# Patient Record
Sex: Female | Born: 1940 | Race: White | Hispanic: No | Marital: Married | Smoking: Former smoker
Health system: Southern US, Community
[De-identification: ages and names within clinical notes are randomized; demographics above are authoritative.]

## PROBLEM LIST (undated history)

## (undated) DIAGNOSIS — K219 Gastro-esophageal reflux disease without esophagitis: Secondary | ICD-10-CM

## (undated) DIAGNOSIS — I10 Essential (primary) hypertension: Secondary | ICD-10-CM

## (undated) DIAGNOSIS — M810 Age-related osteoporosis without current pathological fracture: Secondary | ICD-10-CM

## (undated) DIAGNOSIS — E785 Hyperlipidemia, unspecified: Secondary | ICD-10-CM

## (undated) DIAGNOSIS — E039 Hypothyroidism, unspecified: Secondary | ICD-10-CM

## (undated) HISTORY — PX: HEMORROIDECTOMY: SUR656

## (undated) HISTORY — DX: Age-related osteoporosis without current pathological fracture: M81.0

## (undated) HISTORY — DX: Gastro-esophageal reflux disease without esophagitis: K21.9

## (undated) HISTORY — DX: Hyperlipidemia, unspecified: E78.5

## (undated) HISTORY — DX: Hypothyroidism, unspecified: E03.9

## (undated) HISTORY — PX: ABDOMINAL HYSTERECTOMY: SHX81

## (undated) HISTORY — PX: TONSILLECTOMY: SUR1361

## (undated) HISTORY — DX: Essential (primary) hypertension: I10

---

## 1999-06-20 HISTORY — PX: COLONOSCOPY: SHX174

## 2006-06-19 HISTORY — PX: ESOPHAGOGASTRODUODENOSCOPY: SHX1529

## 2009-06-19 HISTORY — PX: ESOPHAGOGASTRODUODENOSCOPY: SHX1529

## 2015-04-26 ENCOUNTER — Encounter: Payer: Self-pay | Admitting: "Endocrinology

## 2015-04-26 ENCOUNTER — Ambulatory Visit (INDEPENDENT_AMBULATORY_CARE_PROVIDER_SITE_OTHER): Payer: BLUE CROSS/BLUE SHIELD | Admitting: "Endocrinology

## 2015-04-26 VITALS — BP 142/86 | HR 57 | Ht 60.0 in | Wt 150.0 lb

## 2015-04-26 DIAGNOSIS — E079 Disorder of thyroid, unspecified: Secondary | ICD-10-CM

## 2015-04-26 DIAGNOSIS — E559 Vitamin D deficiency, unspecified: Secondary | ICD-10-CM | POA: Diagnosis not present

## 2015-04-26 DIAGNOSIS — E039 Hypothyroidism, unspecified: Secondary | ICD-10-CM | POA: Insufficient documentation

## 2015-04-26 DIAGNOSIS — R739 Hyperglycemia, unspecified: Secondary | ICD-10-CM | POA: Diagnosis not present

## 2015-04-26 DIAGNOSIS — R7303 Prediabetes: Secondary | ICD-10-CM | POA: Insufficient documentation

## 2015-04-26 MED ORDER — VITAMIN D (ERGOCALCIFEROL) 1.25 MG (50000 UNIT) PO CAPS: 50000.0000 [IU] | ORAL_CAPSULE | ORAL | Status: DC

## 2015-04-26 NOTE — Progress Notes (Signed)
Subjective:    Patient ID: Kelli Grant, female    DOB: June 25, 1940,    Past Medical History  Diagnosis Date  . Hyperlipidemia   . Osteoporosis   . Hypertension    Past Surgical History  Procedure Laterality Date  . Hemorroidectomy    . Abdominal hysterectomy    . Tonsillectomy     Social History   Social History  . Marital Status: Married    Spouse Name: N/A  . Number of Children: N/A  . Years of Education: N/A   Social History Main Topics  . Smoking status: Former Games developer  . Smokeless tobacco: None  . Alcohol Use: No  . Drug Use: No  . Sexual Activity: Not Asked   Other Topics Concern  . None   Social History Narrative  . None   Outpatient Encounter Prescriptions as of 04/26/2015  Medication Sig  . ezetimibe (ZETIA) 10 MG tablet Take 10 mg by mouth daily.  Marland Kitchen lisinopril (PRINIVIL,ZESTRIL) 10 MG tablet Take 10 mg by mouth daily.  . meloxicam (MOBIC) 7.5 MG tablet Take 7.5 mg by mouth daily as needed for pain.  . Vitamin D, Ergocalciferol, (DRISDOL) 50000 UNITS CAPS capsule Take 1 capsule (50,000 Units total) by mouth every 7 (seven) days.   No facility-administered encounter medications on file as of 04/26/2015.   ALLERGIES: Allergies  Allergen Reactions  . Ciprofloxacin    VACCINATION STATUS:  There is no immunization history on file for this patient.  HPI  Kelli Grant is a 74-yr-old female patient with medical hx as above. She is here to f/u for f/u after being seen in consultation for abnormal TFTs. She has no new complaints.  she gives remote hx of hypothyroidism which required brief therapy with thyroid hormone. she has unidentified thyroid dysfunction in one of her grandparents. she denies weight gain, cold/heat intolerance. she denies palpitations. she denies hx of goiter, nor exposure to neck radiation. she is a former smoker. she has 4 grown children, all of them were appropriate weight at birth.  Review of  Systems   Constitutional: no weight gain/loss, no fatigue, no subjective hyperthermia/hypothermia Eyes: no blurry vision, no xerophthalmia ENT: no sore throat, no nodules palpated in throat, no dysphagia/odynophagia, no hoarseness Cardiovascular: no CP/SOB/palpitations/leg swelling Respiratory: no cough/SOB Gastrointestinal: no N/V/D/C Musculoskeletal: no muscle/joint aches Skin: no rashes Neurological: no tremors/numbness/tingling/dizziness Psychiatric: no depression/anxiety  Objective:    BP 142/86 mmHg  Pulse 57  Ht 5' (1.524 m)  Wt 150 lb (68.04 kg)  BMI 29.30 kg/m2  SpO2 99%  Wt Readings from Last 3 Encounters:  04/26/15 150 lb (68.04 kg)    Physical Exam  Constitutional: NAD Eyes: PERRLA, EOMI, no exophthalmos ENT: moist mucous membranes, no thyromegaly, no cervical lymphadenopathy Cardiovascular: RRR, No MRG Respiratory: CTA B Gastrointestinal: abdomen soft, NT, ND, BS+ Musculoskeletal: no deformities, strength intact in all 4 Skin: moist, warm, no rashes Neurological: no tremor with outstretched hands, DTR normal in all 4   Assessment & Plan:   1. Disorder of thyroid gland  Her repeat thyroid function is significant for low normal normal profile, TSH high normal at 3.4 and free T4 low normal at 0.9, no evidence of antithyroid autoimmunity. she will not need thyroid intervention for now, may require low thyroid hormone support on subsequent visits.  She will be initiated with vitamin D therapy weekly .  her P/E is negative for Goiter, she will not need thyroid ultrasound.  she will RTN in 4 months with  a1c and TFTs.   2. Blood glucose elevated Her labs show a1c of 5.8% c/w pre-diabetes. carbs precaution and dietary advice given. She would not need intervention with medication for now.  3. Vitamin D deficiency -I advised her to continue vitamin D 50,000 units weekly for the next 12 weeks and maintain with vitamin D3 5000 units daily afterwards. Her  vitamin D level today is 28.8.  I advised patient to maintain close follow up with their PCP for primary care needs. Follow up plan: Return in about 6 months (around 10/24/2015) for follow up with pre-visit labs, abnormal thyroid function.Marquis Lunch.  Gebre Mechille Varghese, MD Phone: [phone]  Fax: [phone]   04/26/2015, 11:56 AM

## 2015-10-25 ENCOUNTER — Ambulatory Visit: Payer: BLUE CROSS/BLUE SHIELD | Admitting: "Endocrinology

## 2015-10-28 ENCOUNTER — Emergency Department (HOSPITAL_COMMUNITY): Payer: Medicare Other

## 2015-10-28 ENCOUNTER — Emergency Department (HOSPITAL_COMMUNITY)
Admission: EM | Admit: 2015-10-28 | Discharge: 2015-10-28 | Disposition: A | Discharge status: Home / Self Care | Payer: Medicare Other | Attending: Emergency Medicine | Admitting: Emergency Medicine

## 2015-10-28 ENCOUNTER — Ambulatory Visit: Payer: BLUE CROSS/BLUE SHIELD | Admitting: "Endocrinology

## 2015-10-28 ENCOUNTER — Encounter: Payer: Self-pay | Admitting: "Endocrinology

## 2015-10-28 ENCOUNTER — Encounter (HOSPITAL_COMMUNITY): Payer: Self-pay | Admitting: Emergency Medicine

## 2015-10-28 DIAGNOSIS — R079 Chest pain, unspecified: Secondary | ICD-10-CM | POA: Diagnosis not present

## 2015-10-28 DIAGNOSIS — M549 Dorsalgia, unspecified: Secondary | ICD-10-CM | POA: Diagnosis not present

## 2015-10-28 DIAGNOSIS — S6391XA Sprain of unspecified part of right wrist and hand, initial encounter: Secondary | ICD-10-CM | POA: Diagnosis not present

## 2015-10-28 DIAGNOSIS — S80211A Abrasion, right knee, initial encounter: Secondary | ICD-10-CM | POA: Diagnosis not present

## 2015-10-28 DIAGNOSIS — W010XXA Fall on same level from slipping, tripping and stumbling without subsequent striking against object, initial encounter: Secondary | ICD-10-CM | POA: Insufficient documentation

## 2015-10-28 DIAGNOSIS — Y999 Unspecified external cause status: Secondary | ICD-10-CM | POA: Insufficient documentation

## 2015-10-28 DIAGNOSIS — S022XXB Fracture of nasal bones, initial encounter for open fracture: Secondary | ICD-10-CM | POA: Diagnosis not present

## 2015-10-28 DIAGNOSIS — E785 Hyperlipidemia, unspecified: Secondary | ICD-10-CM | POA: Diagnosis not present

## 2015-10-28 DIAGNOSIS — I10 Essential (primary) hypertension: Secondary | ICD-10-CM | POA: Insufficient documentation

## 2015-10-28 DIAGNOSIS — Z23 Encounter for immunization: Secondary | ICD-10-CM | POA: Diagnosis not present

## 2015-10-28 DIAGNOSIS — Y939 Activity, unspecified: Secondary | ICD-10-CM | POA: Diagnosis not present

## 2015-10-28 DIAGNOSIS — W19XXXA Unspecified fall, initial encounter: Secondary | ICD-10-CM

## 2015-10-28 DIAGNOSIS — M810 Age-related osteoporosis without current pathological fracture: Secondary | ICD-10-CM | POA: Diagnosis not present

## 2015-10-28 DIAGNOSIS — S0992XA Unspecified injury of nose, initial encounter: Secondary | ICD-10-CM | POA: Diagnosis present

## 2015-10-28 DIAGNOSIS — S0083XA Contusion of other part of head, initial encounter: Secondary | ICD-10-CM | POA: Insufficient documentation

## 2015-10-28 DIAGNOSIS — Y92481 Parking lot as the place of occurrence of the external cause: Secondary | ICD-10-CM | POA: Insufficient documentation

## 2015-10-28 DIAGNOSIS — S60312A Abrasion of left thumb, initial encounter: Secondary | ICD-10-CM | POA: Insufficient documentation

## 2015-10-28 DIAGNOSIS — S6392XA Sprain of unspecified part of left wrist and hand, initial encounter: Secondary | ICD-10-CM | POA: Diagnosis not present

## 2015-10-28 DIAGNOSIS — Z87891 Personal history of nicotine dependence: Secondary | ICD-10-CM | POA: Insufficient documentation

## 2015-10-28 DIAGNOSIS — Z79899 Other long term (current) drug therapy: Secondary | ICD-10-CM | POA: Insufficient documentation

## 2015-10-28 MED ORDER — TETANUS-DIPHTH-ACELL PERTUSSIS 5-2.5-18.5 LF-MCG/0.5 IM SUSP
0.5000 mL | Freq: Once | INTRAMUSCULAR | Status: AC
  Administered 2015-10-28: 0.5 mL via INTRAMUSCULAR
  Filled 2015-10-28: qty 0.5

## 2015-10-28 MED ORDER — ONDANSETRON 4 MG PO TBDP: 4.0000 mg | ORAL_TABLET | Freq: Once | ORAL | Status: DC

## 2015-10-28 MED ORDER — HYDROCODONE-ACETAMINOPHEN 5-325 MG PO TABS
1.0000 | ORAL_TABLET | Freq: Once | ORAL | Status: AC
  Administered 2015-10-28: 0.5 via ORAL
  Filled 2015-10-28: qty 1

## 2015-10-28 MED ORDER — BACITRACIN-NEOMYCIN-POLYMYXIN 400-5-5000 EX OINT
TOPICAL_OINTMENT | CUTANEOUS | Status: AC
  Filled 2015-10-28: qty 1

## 2015-10-28 MED ORDER — ONDANSETRON HCL 4 MG PO TABS
4.0000 mg | ORAL_TABLET | Freq: Once | ORAL | Status: DC
  Administered 2015-10-28: 4 mg via ORAL

## 2015-10-28 MED ORDER — HYDROCODONE-ACETAMINOPHEN 5-325 MG PO TABS: 1.0000 | ORAL_TABLET | Freq: Four times a day (QID) | ORAL | Status: DC | PRN

## 2015-10-28 MED ORDER — ONDANSETRON HCL 4 MG PO TABS
ORAL_TABLET | ORAL | Status: AC
  Filled 2015-10-28: qty 1

## 2015-10-28 NOTE — ED Notes (Signed)
Pt reports facial and oral pain,left hand, epistaxis, right knee pain after tripping over a parking block in the [employer] parking lot. Pt denies being on any blood thinners. Moderate swelling and contusions noted to right eye, right side of face and right knee. Bleeding controlled at this time. Ice pack given. Pt reports wears glasses but reports lens were broken in fall. nad noted. Pt denies loc.

## 2015-10-28 NOTE — Discharge Instructions (Signed)
Extensive workup with CT head neck and face without any bony injuries other than nasal bone fracture. X-rays of both hands right knee and hips and pelvis without any acute bony injuries. Recommend follow up with ear nose and throat for the nasal bone fracture. Referral information provided. Dress the abrasions to the face and right knee with Polysporin type ointment. Take the hydrocodone along with your mobic as needed for pain.

## 2015-10-28 NOTE — ED Provider Notes (Signed)
CSN: [id number]     Arrival date & time 10/28/15  1103 History  By signing my name below, I, Kelli Grant, attest that this documentation has been prepared under the direction and in the presence of Vanetta MuldersScott Zetha Kuhar, MD. Electronically Signed: Ronney LionSuzanne Grant, ED Scribe. 10/28/2015. 12:47 PM.    Chief Complaint  Patient presents with  . Fall   The history is provided by the patient. No language interpreter was used.    HPI Comments: Kelli Grant is a 75 y.o. female with a history of osteoporosis, HTN, and HLD, who presents to the Emergency Department s/p a mechanical fall that occurred PTA. Patient reports she tripped over a parking block in a parking lot and fell forward, landing on her face, although she states she tried to break her fall with her bilateral hands. She also notes an abrasion on her right knee, abrasion and swelling on her nose, bruising, headache, and back pain. She states she has had intermittent chest pain that is reproducible by palpation that has been ongoing for 2 months - she had a CXR recently that was negative. She states she applied ice to her areas of pain with some relief. She denies being on any anticoagulation. She is unsure of any hip pain or neck pain or stiffness presently. She denies dental pain or the sensation , fevers, chills, visual changes, cough, rhinorrhea, sore throat, SOB, abdominal pain, nausea, vomiting, diarrhea, dysuria, leg swelling, bleeding easily, or rash. Patient states she last had a tetanus vaccination 8 years ago.   Past Medical History  Diagnosis Date  . Hyperlipidemia   . Osteoporosis   . Hypertension    Past Surgical History  Procedure Laterality Date  . Hemorroidectomy    . Abdominal hysterectomy    . Tonsillectomy     Family History  Problem Relation Age of Onset  . Diabetes Mother   . Hypertension Mother   . Cancer Mother   . Hypertension Father   . CAD Father    Social History  Substance Use Topics  . Smoking status: Former  Games developermoker  . Smokeless tobacco: None  . Alcohol Use: No   OB History    No data available     Review of Systems  Constitutional: Negative for fever and chills.  HENT: Negative for rhinorrhea and sore throat.   Eyes: Negative for visual disturbance.  Respiratory: Negative for cough and shortness of breath.   Cardiovascular: Positive for chest pain (intermittent, reproducible, ongoing for 2 months). Negative for leg swelling.  Gastrointestinal: Negative for nausea, vomiting, abdominal pain and diarrhea.  Genitourinary: Negative for dysuria.  Musculoskeletal: Positive for back pain and arthralgias.  Skin: Positive for color change (bruising) and wound. Negative for rash.  Neurological: Positive for headaches.  Hematological: Does not bruise/bleed easily.      Allergies  Ciprofloxacin  Home Medications   Prior to Admission medications   Medication Sig Start Date End Date Taking? Authorizing Provider  acidophilus (RISAQUAD) CAPS capsule Take 1 capsule by mouth daily.   Yes Historical Provider, MD  ezetimibe (ZETIA) 10 MG tablet Take 10 mg by mouth daily.   Yes Historical Provider, MD  lisinopril (PRINIVIL,ZESTRIL) 10 MG tablet Take 10 mg by mouth daily.   Yes Historical Provider, MD  MEGARED OMEGA-3 KRILL OIL 500 MG CAPS Take 1 capsule by mouth daily.   Yes Historical Provider, MD  meloxicam (MOBIC) 7.5 MG tablet Take 7.5 mg by mouth daily as needed for pain.   Yes Historical Provider,  MD  Vitamin D, Ergocalciferol, (DRISDOL) 50000 UNITS CAPS capsule Take 1 capsule (50,000 Units total) by mouth every 7 (seven) days. 04/26/15  Yes [name], MD  HYDROcodone-acetaminophen (NORCO/VICODIN) 5-325 MG tablet Take 1 tablet by mouth every 6 (six) hours as needed. 10/28/15   Vanetta Mulders, MD   BP 175/96 mmHg  Pulse 64  Temp(Src) 97.5 F (36.4 C) (Temporal)  Resp 16  Ht 5\' 2"  (1.575 m)  Wt 68.04 kg  BMI 27.43 kg/m2  SpO2 99% Physical Exam  Constitutional: She is oriented to  person, place, and time. She appears well-developed and well-nourished. No distress.  HENT:  Head: Normocephalic and atraumatic.  Mouth/Throat: Oropharynx is clear and moist.  Mucous membranes are moist.   Eyes: Conjunctivae and EOM are normal. Pupils are equal, round, and reactive to light. No scleral icterus.  Pupils are normal. Sclera is clear. Eyes track normal.    Neck: Neck supple. No tracheal deviation present.  Cardiovascular: Normal rate, regular rhythm and normal heart sounds.   Pulmonary/Chest: Effort normal and breath sounds normal. No respiratory distress. She has no wheezes. She has no rales.  Lungs are clear to auscultation bilaterally.  Abdominal: Soft. Bowel sounds are normal. There is no tenderness.  Musculoskeletal: Normal range of motion. She exhibits no edema.  No ankle swelling. Cap refill to bilateral great toes <1 second.  Laceration on bridge of nose about 1 cm. Swelling to bridge of nose. Septum is normal. No septal hematoma. No active bleeding. Bruising and swelling below the right eye. Some abrasions to the right side of the upper lip and lateral lip. No blood draining down posterior pharynx. Normal ROM of neck. Right hand: Bruising on dorsum right hand between thumb and index. Bruising on lateral aspect of the hand. No abrasions. Left hand: Abrasion to left thumb. Bruise at index finger at the PIP joint. Bruising on the lateral aspect of the left hand.  Radial pulses 2+ bilaterally. Cap refill to the fingers <1 second.  Right knee: Fairly large abrasion measuring 4 cm x 4 cm, with swelling, but no effusion.  Left knee normal.   Neurological: She is alert and oriented to person, place, and time.  Skin: Skin is warm and dry.  Psychiatric: She has a normal mood and affect. Her behavior is normal.  Nursing note and vitals reviewed.   ED Course  Procedures (including critical care time)  DIAGNOSTIC STUDIES: Oxygen Saturation is 99% on RA, normal by my  interpretation.    COORDINATION OF CARE: 12:28 PM - Discussed treatment plan with pt at bedside which includes imaging of head and face, neck, and hips. Pt verbalized understanding and agreed to plan.   Labs Review Labs Reviewed - No data to display  Imaging Review Ct Head Wo Contrast  10/28/2015  CLINICAL DATA:  Posttraumatic headache and facial bruising after fall in parking lot. No reported loss of consciousness. EXAM: CT HEAD WITHOUT CONTRAST CT MAXILLOFACIAL WITHOUT CONTRAST CT CERVICAL SPINE WITHOUT CONTRAST TECHNIQUE: Multidetector CT imaging of the head, cervical spine, and maxillofacial structures were performed using the standard protocol without intravenous contrast. Multiplanar CT image reconstructions of the cervical spine and maxillofacial structures were also generated. COMPARISON:  None. FINDINGS: CT HEAD FINDINGS Bony calvarium appears intact. No mass effect or midline shift is noted. Ventricular size is within normal limits. There is no evidence of mass lesion, hemorrhage or acute infarction. CT MAXILLOFACIAL FINDINGS Minimally displaced nasal bone fracture is noted. Paranasal sinuses appear normal. Pterygoid plates  appear normal. Globes and orbits appear normal. CT CERVICAL SPINE FINDINGS No fracture or spondylolisthesis is noted. Mild degenerative disc disease is noted at C4-5 and C5-6 with posterior osteophyte formation. Mild degenerative changes seen involving posterior facet joints of C7-T1. Visualized lung apices are unremarkable. IMPRESSION: Normal head CT. Minimally displaced nasal bone fracture. No other significant abnormality seen in the maxillofacial region. Mild multilevel degenerative disc disease. No acute abnormality seen in cervical spine. Electronically Signed   By: Lupita Raider, M.D.   On: 10/28/2015 13:28   Ct Cervical Spine Wo Contrast  10/28/2015  CLINICAL DATA:  Posttraumatic headache and facial bruising after fall in parking lot. No reported loss of  consciousness. EXAM: CT HEAD WITHOUT CONTRAST CT MAXILLOFACIAL WITHOUT CONTRAST CT CERVICAL SPINE WITHOUT CONTRAST TECHNIQUE: Multidetector CT imaging of the head, cervical spine, and maxillofacial structures were performed using the standard protocol without intravenous contrast. Multiplanar CT image reconstructions of the cervical spine and maxillofacial structures were also generated. COMPARISON:  None. FINDINGS: CT HEAD FINDINGS Bony calvarium appears intact. No mass effect or midline shift is noted. Ventricular size is within normal limits. There is no evidence of mass lesion, hemorrhage or acute infarction. CT MAXILLOFACIAL FINDINGS Minimally displaced nasal bone fracture is noted. Paranasal sinuses appear normal. Pterygoid plates appear normal. Globes and orbits appear normal. CT CERVICAL SPINE FINDINGS No fracture or spondylolisthesis is noted. Mild degenerative disc disease is noted at C4-5 and C5-6 with posterior osteophyte formation. Mild degenerative changes seen involving posterior facet joints of C7-T1. Visualized lung apices are unremarkable. IMPRESSION: Normal head CT. Minimally displaced nasal bone fracture. No other significant abnormality seen in the maxillofacial region. Mild multilevel degenerative disc disease. No acute abnormality seen in cervical spine. Electronically Signed   By: Lupita Raider, M.D.   On: 10/28/2015 13:28   Dg Knee Complete 4 Views Right  10/28/2015  CLINICAL DATA:  Pain following fall EXAM: RIGHT KNEE - COMPLETE 4+ VIEW COMPARISON:  None. FINDINGS: Frontal, lateral, and bilateral oblique views were obtained. There is prepatellar edema/thickening. There is no demonstrable fracture or dislocation. No joint effusion. There is a spur arising from the anterior superior patella. There is no appreciable joint space narrowing. IMPRESSION: Prepatellar soft tissue edema/ thickening. There may be a degree of hemorrhage in this area. No joint effusion. No fracture or dislocation.  No appreciable joint space narrowing. Electronically Signed   By: Bretta Bang III M.D.   On: 10/28/2015 13:32   Dg Hand Complete Left  10/28/2015  CLINICAL DATA:  Acute left hand pain and swelling after fall today in parking lot. Initial encounter. EXAM: LEFT HAND - COMPLETE 3+ VIEW COMPARISON:  None. FINDINGS: There is no evidence of fracture or dislocation. Narrowing and sclerosis is seen involving the first carpal/metacarpal joint. Soft tissues are unremarkable. IMPRESSION: Osteoarthritis of first carpometacarpal joint. No acute abnormality seen in the left hand. Electronically Signed   By: Lupita Raider, M.D.   On: 10/28/2015 13:31   Dg Hand Complete Right  10/28/2015  CLINICAL DATA:  Pain following fall EXAM: RIGHT HAND - COMPLETE 3+ VIEW COMPARISON:  None. FINDINGS: Frontal, oblique, and lateral views were obtained. There is no demonstrable fracture or dislocation. There is fairly marked osteoarthritic change in the first carpal-metacarpal joint. There is milder osteoarthritic change in the scaphotrapezial joint. Other joint spaces appear unremarkable. No erosive change. Bones are mildly osteoporotic. IMPRESSION: Areas of osteoarthritic change. Bones mildly osteoporotic. No fracture or dislocation. Electronically Signed   By:  Bretta Bang III M.D.   On: 10/28/2015 13:30   Dg Hips Bilat With Pelvis 3-4 Views  10/28/2015  CLINICAL DATA:  Pain following fall EXAM: DG HIP (WITH OR WITHOUT PELVIS) 3-4V BILAT COMPARISON:  None. FINDINGS: Frontal pelvis as well as frontal and lateral images of each hip -total five views -obtained. Bones are somewhat osteoporotic. There is no fracture or dislocation. There is slight symmetric narrowing of both hip joints. No erosive change. There are foci of atherosclerotic calcification bilaterally. IMPRESSION: Symmetric narrowing of both hip joints. Bones osteoporotic. No fracture or dislocation. Electronically Signed   By: Bretta Bang III M.D.   On:  10/28/2015 13:33   Ct Maxillofacial Wo Cm  10/28/2015  CLINICAL DATA:  Posttraumatic headache and facial bruising after fall in parking lot. No reported loss of consciousness. EXAM: CT HEAD WITHOUT CONTRAST CT MAXILLOFACIAL WITHOUT CONTRAST CT CERVICAL SPINE WITHOUT CONTRAST TECHNIQUE: Multidetector CT imaging of the head, cervical spine, and maxillofacial structures were performed using the standard protocol without intravenous contrast. Multiplanar CT image reconstructions of the cervical spine and maxillofacial structures were also generated. COMPARISON:  None. FINDINGS: CT HEAD FINDINGS Bony calvarium appears intact. No mass effect or midline shift is noted. Ventricular size is within normal limits. There is no evidence of mass lesion, hemorrhage or acute infarction. CT MAXILLOFACIAL FINDINGS Minimally displaced nasal bone fracture is noted. Paranasal sinuses appear normal. Pterygoid plates appear normal. Globes and orbits appear normal. CT CERVICAL SPINE FINDINGS No fracture or spondylolisthesis is noted. Mild degenerative disc disease is noted at C4-5 and C5-6 with posterior osteophyte formation. Mild degenerative changes seen involving posterior facet joints of C7-T1. Visualized lung apices are unremarkable. IMPRESSION: Normal head CT. Minimally displaced nasal bone fracture. No other significant abnormality seen in the maxillofacial region. Mild multilevel degenerative disc disease. No acute abnormality seen in cervical spine. Electronically Signed   By: Lupita Raider, M.D.   On: 10/28/2015 13:28   I have personally reviewed and evaluated these images and lab results as part of my medical decision-making.   EKG Interpretation None      MDM   Final diagnoses:  Fall, initial encounter  Nasal bone fracture, open, initial encounter  Hand sprain, right, initial encounter  Hand sprain, left, initial encounter  Knee abrasion, right, initial encounter  Contusion of face, initial encounter     Patient status post fall no loss of consciousness. Patient did land on her face and her hands and right knee. Abrasions to the right side of the face and upper lip and right knee will be treated with Polysporin.  CT scan head neck and face shows evidence of a nasal bone fracture which will be followed up by ear nose and throat.  X-rays of both hands right knee hips and pelvis without any bony injuries. Patient's tetanus updated.    I personally performed the services described in this documentation, which was scribed in my presence. The recorded information has been reviewed and is accurate.       Vanetta Mulders, MD 10/28/15 1356

## 2015-11-16 ENCOUNTER — Ambulatory Visit (INDEPENDENT_AMBULATORY_CARE_PROVIDER_SITE_OTHER): Payer: Medicare Other | Admitting: "Endocrinology

## 2015-11-16 ENCOUNTER — Encounter: Payer: Self-pay | Admitting: "Endocrinology

## 2015-11-16 VITALS — BP 144/80 | HR 74 | Ht 62.0 in | Wt 152.0 lb

## 2015-11-16 DIAGNOSIS — R7303 Prediabetes: Secondary | ICD-10-CM

## 2015-11-16 DIAGNOSIS — E039 Hypothyroidism, unspecified: Secondary | ICD-10-CM

## 2015-11-16 DIAGNOSIS — I10 Essential (primary) hypertension: Secondary | ICD-10-CM | POA: Diagnosis not present

## 2015-11-16 DIAGNOSIS — E559 Vitamin D deficiency, unspecified: Secondary | ICD-10-CM

## 2015-11-16 MED ORDER — LEVOTHYROXINE SODIUM 25 MCG PO TABS: 25.0000 ug | ORAL_TABLET | Freq: Every day | ORAL | Status: DC

## 2015-11-16 MED ORDER — VITAMIN D3 125 MCG (5000 UT) PO CAPS: 5000.0000 [IU] | ORAL_CAPSULE | Freq: Every day | ORAL | Status: AC

## 2015-11-16 NOTE — Progress Notes (Signed)
Subjective:    Patient ID: Kelli Grant, female    DOB: [birth year],    Past Medical History  Diagnosis Date  . Hyperlipidemia   . Osteoporosis   . Hypertension    Past Surgical History  Procedure Laterality Date  . Hemorroidectomy    . Abdominal hysterectomy    . Tonsillectomy     Social History   Social History  . Marital Status: Married    Spouse Name: N/A  . Number of Children: N/A  . Years of Education: N/A   Social History Main Topics  . Smoking status: Former Games developer  . Smokeless tobacco: None  . Alcohol Use: No  . Drug Use: No  . Sexual Activity: Not Asked   Other Topics Concern  . None   Social History Narrative   Outpatient Encounter Prescriptions as of 11/16/2015  Medication Sig  . acidophilus (RISAQUAD) CAPS capsule Take 1 capsule by mouth daily.  . Cholecalciferol (VITAMIN D3) 5000 units CAPS Take 1 capsule (5,000 Units total) by mouth daily.  Marland Kitchen ezetimibe (ZETIA) 10 MG tablet Take 10 mg by mouth daily.  Marland Kitchen HYDROcodone-acetaminophen (NORCO/VICODIN) 5-325 MG tablet Take 1 tablet by mouth every 6 (six) hours as needed.  Marland Kitchen levothyroxine (SYNTHROID, LEVOTHROID) 25 MCG tablet Take 1 tablet (25 mcg total) by mouth daily.  Marland Kitchen lisinopril (PRINIVIL,ZESTRIL) 10 MG tablet Take 10 mg by mouth daily.  Marland Kitchen MEGARED OMEGA-3 KRILL OIL 500 MG CAPS Take 1 capsule by mouth daily.  . meloxicam (MOBIC) 7.5 MG tablet Take 7.5 mg by mouth daily as needed for pain.  . [DISCONTINUED] Vitamin D, Ergocalciferol, (DRISDOL) 50000 UNITS CAPS capsule Take 1 capsule (50,000 Units total) by mouth every 7 (seven) days.   No facility-administered encounter medications on file as of 11/16/2015.   ALLERGIES: Allergies  Allergen Reactions  . Ciprofloxacin    VACCINATION STATUS: Immunization History  Administered Date(s) Administered  . Tdap 10/28/2015    HPI  Kelli Grant is a 75-yr-old female patient with medical hx as above. She is here to f/u for f/u her history of  subclinical hypothyroidism. She is not on any thyroid hormone replacement. She has no new complaints.  she gives remote hx of hypothyroidism which required brief therapy with thyroid hormone. she has unidentified thyroid dysfunction in one of her grandparents. she denies weight gain, cold/heat intolerance. she denies palpitations. she denies hx of goiter, nor exposure to neck radiation. she is a former smoker. she has 4 grown children, all of them were appropriate weight at birth.  Review of Systems   Constitutional:  Steady weight, no fatigue, no subjective hyperthermia/hypothermia Eyes: no blurry vision, no xerophthalmia ENT: no sore throat, no nodules palpated in throat, no dysphagia/odynophagia, no hoarseness Cardiovascular: no CP/SOB/palpitations/leg swelling Respiratory: no cough/SOB Gastrointestinal: no N/V/D/C Musculoskeletal: no muscle/joint aches Skin: no rashes Neurological: no tremors/numbness/tingling/dizziness Psychiatric: no depression/anxiety  Objective:    BP 144/80 mmHg  Pulse 74  Ht  (1.575 m)  Wt 152 lb (68.947 kg)  BMI 27.79 kg/m2  Wt Readings from Last 3 Encounters:  11/16/15 152 lb (68.947 kg)  10/28/15 150 lb (68.04 kg)  04/26/15 150 lb (68.04 kg)    Physical Exam  Constitutional: NAD Eyes: PERRLA, EOMI, no exophthalmos ENT: moist mucous membranes, no thyromegaly, no cervical lymphadenopathy Cardiovascular: RRR, No MRG Respiratory: CTA B Gastrointestinal: abdomen soft, NT, ND, BS+ Musculoskeletal: no deformities, strength intact in all 4 Skin: moist, warm, no rashes Neurological: no tremor with outstretched hands, DTR normal  in all 4  On 10/19/2015 her labs showed TSH elevated at 4.85 and free T4 low normal at 0.87 Vitamin D was low at 26.5  Assessment & Plan:   1. Disorder of thyroid gland  Her repeat thyroid function is significant for Hypothyroidism . I discussed thyroid hormone replacement with her. She agrees with plan to initiate  low-dose levothyroxine 25 g by mouth every morning.  - We discussed about correct intake of levothyroxine, at fasting, with water, separated by at least 30 minutes from breakfast, and separated by more than 4 hours from calcium, iron, multivitamins, acid reflux medications (PPIs). -Patient is made aware of the fact that thyroid hormone replacement is needed for life, dose to be adjusted by periodic monitoring of thyroid function tests.  Her P/E is negative for Goiter, she will not need thyroid ultrasound.   2. Hypertension: Uncontrolled. I discussed the need to adjust her medications either by increasing the dose of lisinopril or add a diuretic. However she is hesitant to do so at this point. She states her blood pressure is "normal " at home. I advised her to measure her blood pressure at least once a week, right down those numbers, and bring those records with her next visit or discuss with her primary care physician.   3. Prediabetes Her  Prior labs showed a1c of 5.8% c/w pre-diabetes. carbs precaution and dietary advice given. She would not need intervention with medication for now. I will include A1c with her next blood work.  4. Vitamin D deficiency -I advised her to continue vitamin D 50,000 units weekly, however she says she did not tolerate this medication. I advised her to continue vitamin D3 5,000 units daily for 90 days.  I advised patient to maintain close follow up with their PCP for primary care needs. Follow up plan: Return in about 3 months (around 02/16/2016) for underactive thyroid, follow up with pre-visit labs.  Marquis LunchGebre Keilany Burnette, MD Phone: [phone]  Fax: [phone]   11/16/2015, 10:31 AM

## 2015-11-25 ENCOUNTER — Ambulatory Visit: Payer: BLUE CROSS/BLUE SHIELD | Admitting: "Endocrinology

## 2016-02-24 ENCOUNTER — Ambulatory Visit (INDEPENDENT_AMBULATORY_CARE_PROVIDER_SITE_OTHER): Payer: Medicare Other | Admitting: "Endocrinology

## 2016-02-24 ENCOUNTER — Encounter: Payer: Self-pay | Admitting: "Endocrinology

## 2016-02-24 VITALS — BP 133/76 | HR 65 | Ht 62.0 in | Wt 153.0 lb

## 2016-02-24 DIAGNOSIS — E039 Hypothyroidism, unspecified: Secondary | ICD-10-CM

## 2016-02-24 DIAGNOSIS — E559 Vitamin D deficiency, unspecified: Secondary | ICD-10-CM | POA: Diagnosis not present

## 2016-02-24 DIAGNOSIS — R7303 Prediabetes: Secondary | ICD-10-CM

## 2016-02-24 DIAGNOSIS — I10 Essential (primary) hypertension: Secondary | ICD-10-CM

## 2016-02-24 MED ORDER — LEVOTHYROXINE SODIUM 50 MCG PO TABS: 50.0000 ug | ORAL_TABLET | Freq: Every day | ORAL | 1 refills | Status: DC

## 2016-02-24 NOTE — Progress Notes (Signed)
Subjective:    Patient ID: Kelli Grant, female    DOB: [birth year],    Past Medical History:  Diagnosis Date  . Hyperlipidemia   . Hypertension   . Osteoporosis    Past Surgical History:  Procedure Laterality Date  . ABDOMINAL HYSTERECTOMY    . HEMORROIDECTOMY    . TONSILLECTOMY     Social History   Social History  . Marital status: Married    Spouse name: N/A  . Number of children: N/A  . Years of education: N/A   Social History Main Topics  . Smoking status: Former Games developermoker  . Smokeless tobacco: Never Used  . Alcohol use No  . Drug use: No  . Sexual activity: Not Asked   Other Topics Concern  . None   Social History Narrative  . None   Outpatient Encounter Prescriptions as of 02/24/2016  Medication Sig  . acidophilus (RISAQUAD) CAPS capsule Take 1 capsule by mouth daily.  . Cholecalciferol (VITAMIN D3) 5000 units CAPS Take 1 capsule (5,000 Units total) by mouth daily.  Marland Kitchen. ezetimibe (ZETIA) 10 MG tablet Take 10 mg by mouth daily.  Marland Kitchen. HYDROcodone-acetaminophen (NORCO/VICODIN) 5-325 MG tablet Take 1 tablet by mouth every 6 (six) hours as needed.  Marland Kitchen. levothyroxine (SYNTHROID, LEVOTHROID) 50 MCG tablet Take 1 tablet (50 mcg total) by mouth daily.  Marland Kitchen. lisinopril (PRINIVIL,ZESTRIL) 10 MG tablet Take 10 mg by mouth daily.  Marland Kitchen. MEGARED OMEGA-3 KRILL OIL 500 MG CAPS Take 1 capsule by mouth daily.  . meloxicam (MOBIC) 7.5 MG tablet Take 7.5 mg by mouth daily as needed for pain.  . [DISCONTINUED] levothyroxine (SYNTHROID, LEVOTHROID) 25 MCG tablet Take 1 tablet (25 mcg total) by mouth daily.   No facility-administered encounter medications on file as of 02/24/2016.    ALLERGIES: Allergies  Allergen Reactions  . Ciprofloxacin    VACCINATION STATUS: Immunization History  Administered Date(s) Administered  . Tdap 10/28/2015    HPI  [name] is a 75-yr-old female patient with medical hx as above. She is here to f/u for f/u Of hypothyroidism, prediabetes,  vitamin D deficiency. -She was initiated on levothyroxine 25 g by mouth every morning last visit. She reports significant improvement in her symptoms. She says she has more energy these days. She has a steady weight.  She has no new complaints.  she has unidentified thyroid dysfunction in one of her grandparents. she denies weight gain, cold/heat intolerance. she denies palpitations. she denies hx of goiter, nor exposure to neck radiation. she is a former smoker. she has 4 grown children, all of them were appropriate weight at birth.  Review of Systems   Constitutional:  Steady weight, no fatigue, no subjective hyperthermia/hypothermia Eyes: no blurry vision, no xerophthalmia ENT: no sore throat, no nodules palpated in throat, no dysphagia/odynophagia, no hoarseness Cardiovascular: no CP/SOB/palpitations/leg swelling Respiratory: no cough/SOB Gastrointestinal: no N/V/D/C Musculoskeletal: no muscle/joint aches Skin: no rashes Neurological: no tremors/numbness/tingling/dizziness Psychiatric: no depression/anxiety  Objective:    BP 133/76   Pulse 65   Ht 5\' 2"  (1.575 m)   Wt 153 lb (69.4 kg)   BMI 27.98 kg/m   Wt Readings from Last 3 Encounters:  02/24/16 153 lb (69.4 kg)  11/16/15 152 lb (68.9 kg)  10/28/15 150 lb (68 kg)    Physical Exam  Constitutional: NAD Eyes: PERRLA, EOMI, no exophthalmos ENT: moist mucous membranes, no thyromegaly, no cervical lymphadenopathy Cardiovascular: RRR, No MRG Respiratory: CTA B Gastrointestinal: abdomen soft, NT, ND, BS+ Musculoskeletal: no deformities,  strength intact in all 4 Skin: moist, warm, no rashes Neurological: no tremor with outstretched hands, DTR normal in all 4  Her labs on 02/11/2016 shows A1c 5.5%, free T4 0.96, TSH 1.84, and vitamin D 47  Assessment & Plan:   1. Disorder of thyroid gland  Her repeat thyroid function Shows improved profile. She would benefit from slight increase in her thyroid hormone. I would  increase her levothyroxine to 50 g by mouth every morning.   - We discussed about correct intake of levothyroxine, at fasting, with water, separated by at least 30 minutes from breakfast, and separated by more than 4 hours from calcium, iron, multivitamins, acid reflux medications (PPIs). -Patient is made aware of the fact that thyroid hormone replacement is needed for life, dose to be adjusted by periodic monitoring of thyroid function tests.  Her P/E is negative for Goiter, she will not need thyroid ultrasound.   2. Hypertension: controlled. I advised her to continue her current medications. She is on lisinopril 10 mg by mouth daily.  3. Prediabetes Her   labs showed a1c of  5.5% improving from 5.8% c/w pre-diabetes. carbs precaution and dietary advice given. She would not need intervention with medication for now.  4. Vitamin D deficiency - She is status post therapy with  vitamin D 50,000 units ,  I advised her to continue vitamin D3 5,000 units daily for 90 days.  I advised patient to maintain close follow up with their PCP for primary care needs. Follow up plan: Return in about 6 months (around 08/23/2016) for follow up with pre-visit labs.  Marquis Lunch, MD Phone: [phone]  Fax: [phone]   02/24/2016, 11:33 AM

## 2016-02-25 ENCOUNTER — Telehealth: Payer: Self-pay

## 2016-02-25 NOTE — Telephone Encounter (Signed)
Pt called to confirm that she is supposed to take the Vitamin D3 5000 for the next 90 days. I advised her that this was correct.

## 2016-08-15 LAB — TSH: TSH: 1.12 u[IU]/mL (ref <=5.90)

## 2016-08-24 ENCOUNTER — Encounter: Payer: Self-pay | Admitting: "Endocrinology

## 2016-08-24 ENCOUNTER — Ambulatory Visit (INDEPENDENT_AMBULATORY_CARE_PROVIDER_SITE_OTHER): Payer: Medicare Other | Admitting: "Endocrinology

## 2016-08-24 VITALS — BP 133/84 | HR 62 | Wt 151.0 lb

## 2016-08-24 DIAGNOSIS — E039 Hypothyroidism, unspecified: Secondary | ICD-10-CM

## 2016-08-24 DIAGNOSIS — I10 Essential (primary) hypertension: Secondary | ICD-10-CM

## 2016-08-24 DIAGNOSIS — E559 Vitamin D deficiency, unspecified: Secondary | ICD-10-CM | POA: Diagnosis not present

## 2016-08-24 DIAGNOSIS — R7303 Prediabetes: Secondary | ICD-10-CM

## 2016-08-24 MED ORDER — SYNTHROID 50 MCG PO TABS: 50.0000 ug | ORAL_TABLET | Freq: Every day | ORAL | 1 refills | Status: DC

## 2016-08-24 NOTE — Addendum Note (Signed)
Addended by: Jannifer FranklinFRENCH, KIMBERLY A on: 08/24/2016 11:43 AM   Modules accepted: Orders

## 2016-08-24 NOTE — Progress Notes (Signed)
Subjective:    Patient ID: Kelli Grant, female    DOB: 04-10-1941,    Past Medical History:  Diagnosis Date  . Hyperlipidemia   . Hypertension   . Osteoporosis    Past Surgical History:  Procedure Laterality Date  . ABDOMINAL HYSTERECTOMY    . HEMORROIDECTOMY    . TONSILLECTOMY     Social History   Social History  . Marital status: Married    Spouse name: N/A  . Number of children: N/A  . Years of education: N/A   Social History Main Topics  . Smoking status: Former Games developer  . Smokeless tobacco: Never Used  . Alcohol use No  . Drug use: No  . Sexual activity: Not Asked   Other Topics Concern  . None   Social History Narrative  . None   Outpatient Encounter Prescriptions as of 08/24/2016  Medication Sig  . acidophilus (RISAQUAD) CAPS capsule Take 1 capsule by mouth daily.  . Cholecalciferol (VITAMIN D3) 5000 units CAPS Take 1 capsule (5,000 Units total) by mouth daily.  Marland Kitchen ezetimibe (ZETIA) 10 MG tablet Take 10 mg by mouth daily.  Marland Kitchen HYDROcodone-acetaminophen (NORCO/VICODIN) 5-325 MG tablet Take 1 tablet by mouth every 6 (six) hours as needed.  Marland Kitchen lisinopril (PRINIVIL,ZESTRIL) 10 MG tablet Take 10 mg by mouth daily.  Marland Kitchen MEGARED OMEGA-3 KRILL OIL 500 MG CAPS Take 1 capsule by mouth daily.  . meloxicam (MOBIC) 7.5 MG tablet Take 7.5 mg by mouth daily as needed for pain.  Marland Kitchen SYNTHROID 50 MCG tablet Take 1 tablet (50 mcg total) by mouth daily before breakfast.  . [DISCONTINUED] levothyroxine (SYNTHROID, LEVOTHROID) 50 MCG tablet Take 1 tablet (50 mcg total) by mouth daily.   No facility-administered encounter medications on file as of 08/24/2016.    ALLERGIES: Allergies  Allergen Reactions  . Ciprofloxacin    VACCINATION STATUS: Immunization History  Administered Date(s) Administered  . Tdap 10/28/2015    HPI  Kelli Grant is a 76-yr-old female patient with medical hx as above. She is here to f/u of hypothyroidism, prediabetes, vitamin D  deficiency. -She was initiated on levothyroxine 50 g by mouth every morning last visit.  - She has multiple complaints including low energy, fluctuating blood pressure, fatigue, and cold intolerance.  She has a steady weight.   she has unidentified thyroid dysfunction in one of her grandparents. she denies weight gain, cold/heat intolerance. she denies palpitations. she denies hx of goiter, nor exposure to neck radiation. she is a former smoker. she has 4 grown children, all of them were appropriate weight at birth.  Review of Systems   Constitutional:  Steady weight, + fatigue, no subjective hyperthermia/hypothermia Eyes: no blurry vision, no xerophthalmia ENT: no sore throat, no nodules palpated in throat, no dysphagia/odynophagia, no hoarseness Cardiovascular: no CP/SOB/palpitations/leg swelling Respiratory: no cough/SOB Gastrointestinal: no N/V/D/C Musculoskeletal: no muscle/joint aches Skin: no rashes Neurological: no tremors/numbness/tingling/dizziness Psychiatric: no depression/anxiety  Objective:    BP 133/84   Pulse 62   Wt 151 lb (68.5 kg)   BMI 27.62 kg/m   Wt Readings from Last 3 Encounters:  08/24/16 151 lb (68.5 kg)  02/24/16 153 lb (69.4 kg)  11/16/15 152 lb (68.9 kg)    Physical Exam  Constitutional: NAD Eyes: PERRLA, EOMI, no exophthalmos ENT: moist mucous membranes, no thyromegaly, no cervical lymphadenopathy Cardiovascular: RRR, No MRG Respiratory: CTA B Gastrointestinal: abdomen soft, NT, ND, BS+ Musculoskeletal: no deformities, strength intact in all 4 Skin: moist, warm, no rashes Neurological: no  tremor with outstretched hands, DTR normal in all 4 Recent Results (from the past 2160 hour(s))  TSH     Status: None   Collection Time: 08/15/16 12:00 AM  Result Value Ref Range   TSH 1.12 0.41 - 5.90 uIU/mL    Comment: 1.02   Her labs from 08/15/2016 showed free T4 1.02, TSH 1.12  Her labs on 02/11/2016 shows A1c 5.5%, free T4 0.96, TSH 1.84,  and vitamin D 47  Assessment & Plan:   1. Disorder of thyroid gland  Her repeat thyroid function Shows improved profile, Free T4 low normal at 1.02.  - She complains of a lot of nonspecific symptoms. -  She would have benefited from slight increase in her thyroid hormone, but she declines the offer to increase the dose.  - She may benefit from the brand form of levothyroxine, Synthroid, hence I will switch to Synthroid 50 g by mouth every morning.   - We discussed about correct intake of levothyroxine, at fasting, with water, separated by at least 30 minutes from breakfast, and separated by more than 4 hours from calcium, iron, multivitamins, acid reflux medications (PPIs). -Patient is made aware of the fact that thyroid hormone replacement is needed for life, dose to be adjusted by periodic monitoring of thyroid function tests.  Her P/E is negative for Goiter, she will not need thyroid ultrasound.   2. Hypertension: controlled. I advised her to continue her current medications. She is on lisinopril 10 mg by mouth daily.  3. Prediabetes Her last  labs showed a1c of  5.5% improving from 5.8% c/w pre-diabetes. carbs precaution and dietary advice given. She will not need intervention with medication for now.  4. Vitamin D deficiency - She is status post therapy with  vitamin D 50,000 units ,  I advised her to continue vitamin D3 5,000 units daily for 90 days.  I advised patient to maintain close follow up with their PCP for primary care needs. Follow up plan: Return in about 6 months (around 02/24/2017) for follow up with pre-visit labs.  Marquis LunchGebre Mylin Gignac, MD Phone: [phone]  Fax: [phone]   08/24/2016, 11:18 AM

## 2016-08-31 ENCOUNTER — Other Ambulatory Visit: Payer: Self-pay | Admitting: "Endocrinology

## 2016-08-31 MED ORDER — SYNTHROID 50 MCG PO TABS: 50.0000 ug | ORAL_TABLET | Freq: Every day | ORAL | 1 refills | Status: DC

## 2016-09-04 ENCOUNTER — Other Ambulatory Visit: Payer: Self-pay

## 2016-09-04 MED ORDER — LEVOTHYROXINE SODIUM 50 MCG PO TABS: 50.0000 ug | ORAL_TABLET | Freq: Every day | ORAL | 1 refills | Status: DC

## 2016-12-01 ENCOUNTER — Other Ambulatory Visit: Payer: Self-pay | Admitting: Obstetrics and Gynecology

## 2016-12-01 DIAGNOSIS — Z1231 Encounter for screening mammogram for malignant neoplasm of breast: Secondary | ICD-10-CM

## 2017-02-23 LAB — TSH: TSH: 1 (ref <=5.90)

## 2017-02-23 LAB — HEMOGLOBIN A1C: HEMOGLOBIN A1C: 5.5

## 2017-03-01 ENCOUNTER — Ambulatory Visit (INDEPENDENT_AMBULATORY_CARE_PROVIDER_SITE_OTHER): Payer: Medicare Other | Admitting: "Endocrinology

## 2017-03-01 ENCOUNTER — Encounter: Payer: Self-pay | Admitting: "Endocrinology

## 2017-03-01 VITALS — BP 138/86 | HR 67 | Ht 62.0 in | Wt 147.0 lb

## 2017-03-01 DIAGNOSIS — R7303 Prediabetes: Secondary | ICD-10-CM | POA: Diagnosis not present

## 2017-03-01 DIAGNOSIS — E559 Vitamin D deficiency, unspecified: Secondary | ICD-10-CM | POA: Diagnosis not present

## 2017-03-01 DIAGNOSIS — E039 Hypothyroidism, unspecified: Secondary | ICD-10-CM | POA: Diagnosis not present

## 2017-03-01 MED ORDER — LEVOTHYROXINE SODIUM 50 MCG PO TABS: 50.0000 ug | ORAL_TABLET | Freq: Every day | ORAL | 1 refills | Status: AC

## 2017-03-01 NOTE — Progress Notes (Signed)
Subjective:    Patient ID: Kelli Grant, female    DOB: Jun 23, 1940,    Past Medical History:  Diagnosis Date  . Hyperlipidemia   . Hypertension   . Osteoporosis    Past Surgical History:  Procedure Laterality Date  . ABDOMINAL HYSTERECTOMY    . HEMORROIDECTOMY    . TONSILLECTOMY     Social History   Social History  . Marital status: Married    Spouse name: N/A  . Number of children: N/A  . Years of education: N/A   Social History Main Topics  . Smoking status: Former Games developer  . Smokeless tobacco: Never Used  . Alcohol use No  . Drug use: No  . Sexual activity: Not Asked   Other Topics Concern  . None   Social History Narrative  . None   Outpatient Encounter Prescriptions as of 03/01/2017  Medication Sig  . ranitidine (ZANTAC) 150 MG tablet Take 150 mg by mouth at bedtime.  Marland Kitchen acidophilus (RISAQUAD) CAPS capsule Take 1 capsule by mouth daily.  . Cholecalciferol (VITAMIN D3) 5000 units CAPS Take 1 capsule (5,000 Units total) by mouth daily.  Marland Kitchen ezetimibe (ZETIA) 10 MG tablet Take 10 mg by mouth daily.  Marland Kitchen HYDROcodone-acetaminophen (NORCO/VICODIN) 5-325 MG tablet Take 1 tablet by mouth every 6 (six) hours as needed.  Marland Kitchen levothyroxine (SYNTHROID, LEVOTHROID) 50 MCG tablet Take 1 tablet (50 mcg total) by mouth daily.  Marland Kitchen lisinopril (PRINIVIL,ZESTRIL) 10 MG tablet Take 10 mg by mouth daily.  Marland Kitchen MEGARED OMEGA-3 KRILL OIL 500 MG CAPS Take 1 capsule by mouth daily.  . meloxicam (MOBIC) 7.5 MG tablet Take 7.5 mg by mouth daily as needed for pain.  . [DISCONTINUED] levothyroxine (SYNTHROID, LEVOTHROID) 50 MCG tablet Take 1 tablet (50 mcg total) by mouth daily.   No facility-administered encounter medications on file as of 03/01/2017.    ALLERGIES: Allergies  Allergen Reactions  . Ciprofloxacin    VACCINATION STATUS: Immunization History  Administered Date(s) Administered  . Tdap 10/28/2015    HPI  Kelli Grant is a 76-yr-old female patient with medical   history as above. She is here to f/u of hypothyroidism, prediabetes, vitamin D deficiency. -She  is currently on levothyroxine 50 g by mouth every morning last visit.  - She reports feeling better, has no new complaints today.   She has a steady weight. she has unidentified thyroid dysfunction in one of her grandparents. she denies weight gain, cold/heat intolerance. she denies palpitations. she denies hx of goiter, nor exposure to neck radiation. she is a former smoker. she has 4 grown children.  Review of Systems   Constitutional: + Intentionally and progressively lost 6 pounds over the last year, + fatigue, no subjective hyperthermia/hypothermia Eyes: no blurry vision, no xerophthalmia ENT: no sore throat, no nodules palpated in throat, no dysphagia/odynophagia, no hoarseness Cardiovascular: no CP/SOB/palpitations/leg swelling Respiratory: no cough/SOB Gastrointestinal: no N/V/D/C Musculoskeletal: no muscle/joint aches Skin: no rashes Neurological: no tremors/numbness/tingling/dizziness Psychiatric: no depression/anxiety  Objective:    BP 138/86   Pulse 67   Ht  (1.575 m)   Wt 147 lb (66.7 kg)   BMI 26.89 kg/m   Wt Readings from Last 3 Encounters:  03/01/17 147 lb (66.7 kg)  08/24/16 151 lb (68.5 kg)  02/24/16 153 lb (69.4 kg)    Physical Exam  Constitutional: NAD Eyes: PERRLA, EOMI, no exophthalmos ENT: moist mucous membranes, no thyromegaly, no cervical lymphadenopathy Cardiovascular: RRR, No MRG Respiratory: CTA B Gastrointestinal: abdomen soft, NT,  ND, BS+ Musculoskeletal: no deformities, strength intact in all 4 Skin: moist, warm, no rashes Neurological: no tremor with outstretched hands, DTR normal in all 4   Labs from 02/23/2017 current TSH 1.00, free T4 0.93, A1c 5.5% Her labs from 08/15/2016 showed free T4 1.02, TSH 1.12  Her labs on 02/11/2016 shows A1c 5.5%, free T4 0.96, TSH 1.84, and vitamin D 47  Assessment & Plan:   1. Primary  hypothyroidism - Her repeat thyroid function tests are consistent with appropriate replacement. -  She would have benefited from slight increase in her thyroid hormone, but she declines the offer to increase the dose.  - She would like to stay on levothyroxine 50 g by mouth every morning.  - We discussed about correct intake of levothyroxine, at fasting, with water, separated by at least 30 minutes from breakfast, and separated by more than 4 hours from calcium, iron, multivitamins, acid reflux medications (PPIs). -Patient is made aware of the fact that thyroid hormone replacement is needed for life, dose to be adjusted by periodic monitoring of thyroid function tests.  Her P/E is negative for goiter, she will not need thyroid ultrasound.   2. Hypertension: controlled. I advised her to continue her current medications. She is on lisinopril 10 mg by mouth daily.  3. Prediabetes Her last  labs showed a1c of  5.5% improving from 5.8% c/w pre-diabetes. carbs precaution and dietary advice given. She will not need intervention with medication for now.  4. Vitamin D deficiency - She is status post therapy with  vitamin D 50,000 units ,  I advised her to continue vitamin D3 5,000 units daily for 90 days.  I advised patient to maintain close follow up with her PCP for primary care needs. Follow up plan: Return in about 1 year (around 03/01/2018), or if symptoms worsen or fail to improve, for follow up with pre-visit labs.  Marquis LunchGebre Nida, MD Phone: [phone]  Fax: [phone]  This note was partially dictated with voice recognition software. Similar sounding words can be transcribed inadequately or may not  be corrected upon review.  03/01/2017, 12:54 PM

## 2017-10-20 IMAGING — DX DG HIP (WITH OR WITHOUT PELVIS) 3-4V BILAT
5 series · 5 of 5 positions shown · non-contrast
Comparison: None.

CLINICAL DATA: Pain following fall

EXAM:
DG HIP (WITH OR WITHOUT PELVIS) 3-4V BILAT

[pelvis ap]
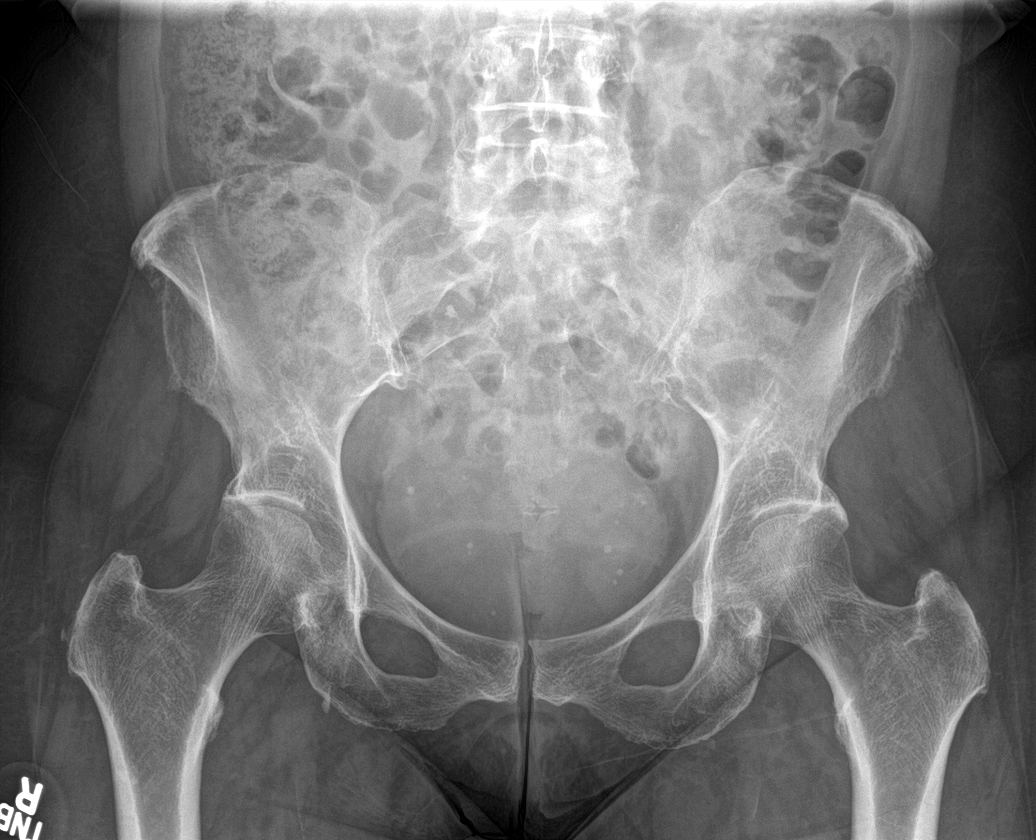

[hip ap (1 of 2)]
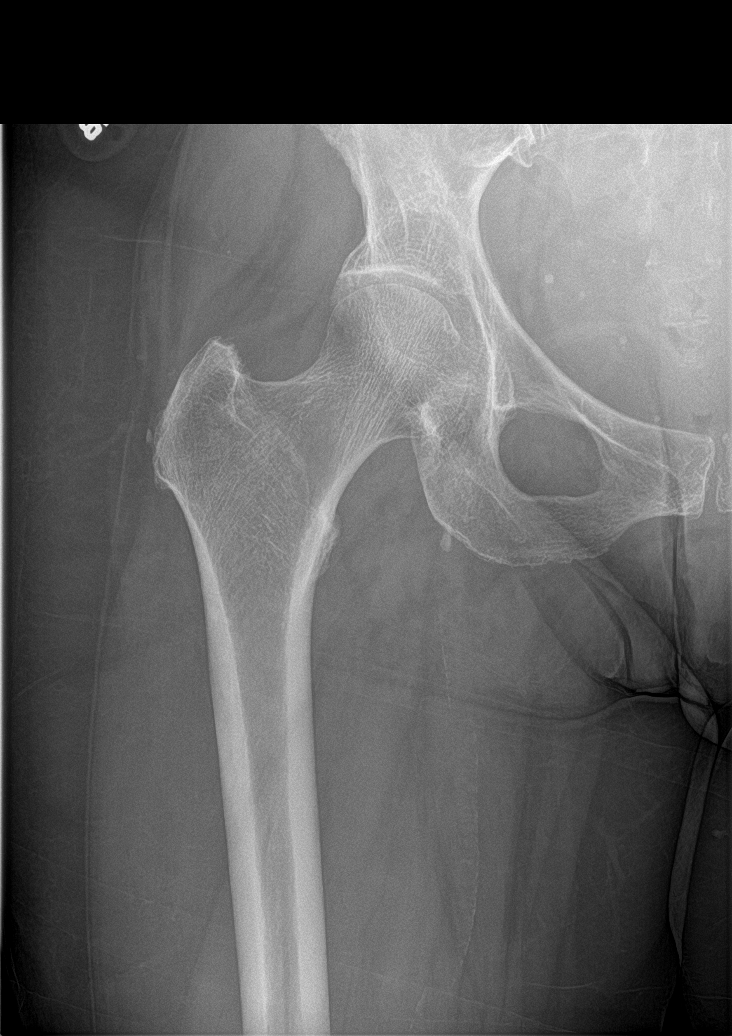

[hip lat (1 of 2)]
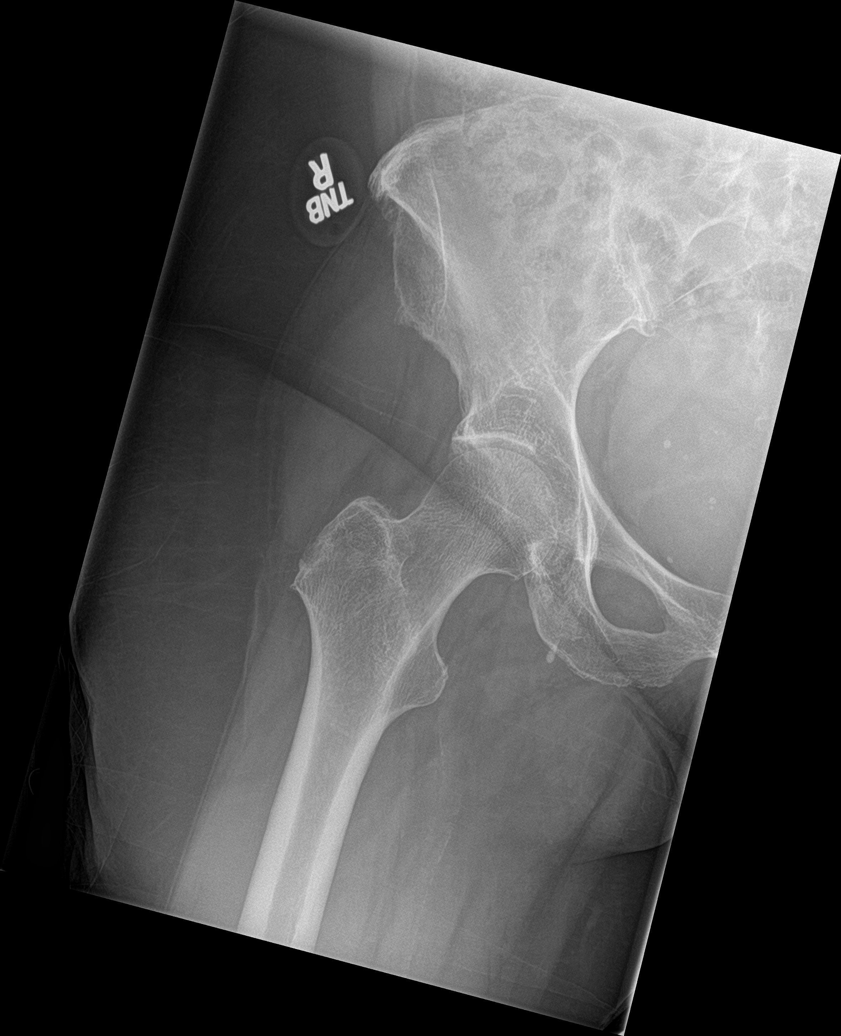

[hip ap (2 of 2)]
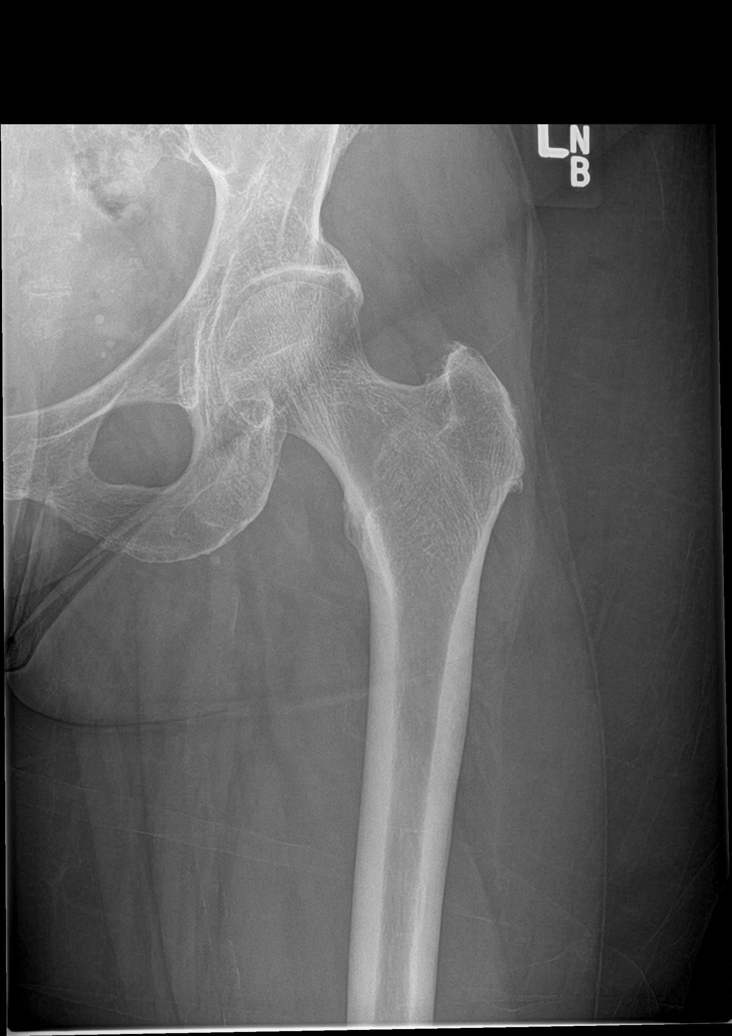

[hip lat (2 of 2)]
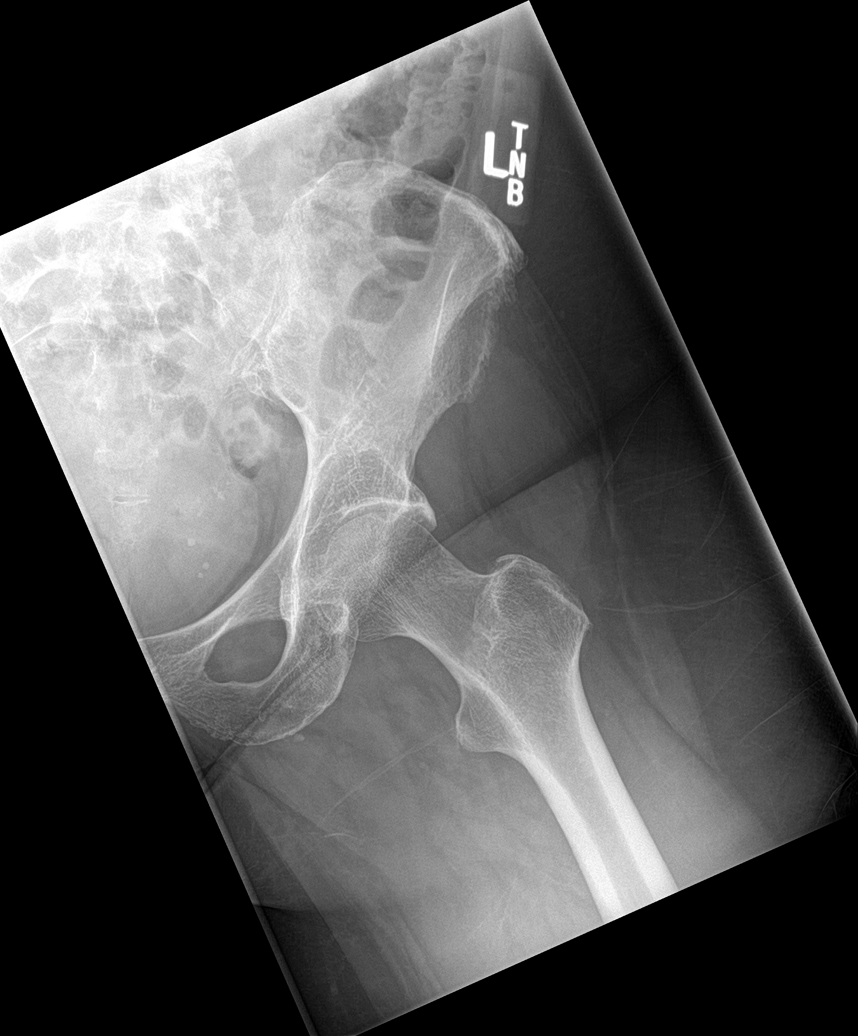

[5 of 5 positions shown; findings below may reference images not displayed]

FINDINGS: Frontal pelvis as well as frontal and lateral images of each hip
-total five views -obtained. Bones are somewhat osteoporotic. There
is no fracture or dislocation. There is slight symmetric narrowing
of both hip joints. No erosive change. There are foci of
atherosclerotic calcification bilaterally.
IMPRESSION: Symmetric narrowing of both hip joints. Bones osteoporotic. No
fracture or dislocation.

## 2018-01-31 ENCOUNTER — Encounter: Payer: Self-pay | Admitting: "Endocrinology

## 2018-03-01 ENCOUNTER — Ambulatory Visit: Payer: Medicare Other | Admitting: "Endocrinology

## 2020-01-28 ENCOUNTER — Encounter: Payer: Self-pay | Admitting: Internal Medicine

## 2020-03-23 ENCOUNTER — Ambulatory Visit: Payer: Medicare Other | Admitting: Gastroenterology

## 2020-03-23 ENCOUNTER — Other Ambulatory Visit: Payer: Self-pay

## 2020-03-23 ENCOUNTER — Encounter: Payer: Self-pay | Admitting: Gastroenterology

## 2020-03-23 DIAGNOSIS — R1310 Dysphagia, unspecified: Secondary | ICD-10-CM | POA: Diagnosis not present

## 2020-03-23 DIAGNOSIS — K641 Second degree hemorrhoids: Secondary | ICD-10-CM

## 2020-03-23 NOTE — Patient Instructions (Signed)
We are arranging an upper endoscopy with dilation in the near future by Dr. Marletta Lor. Continue omeprazole each morning, 30 minutes before breakfast, as it is best absorbed on an empty stomach.  Avoid straining, limit toilet time to 2-3 minutes. I recommend adding Benefiber 2 teaspoons daily, and can take Miralax 1 capful if needed for constipation. Continue probiotics as you are doing.  Further recommendations to follow!  It was a pleasure to see you today. I want to create trusting relationships with patients to provide genuine, compassionate, and quality care. I value your feedback. If you receive a survey regarding your visit,  I greatly appreciate you taking time to fill this out.   Gelene Mink, PhD, ANP-BC [facility]

## 2020-03-23 NOTE — Progress Notes (Signed)
Primary Care Physician:  Freda Munro, NP  Referring Physician: Freda Munro, NP Primary Gastroenterologist:  Dr. Marletta Lor   Chief Complaint  Patient presents with  . Consult    TCS last done 10 year ago    HPI:   Kelli Grant is a 79 y.o. female presenting today at the request of Freda Munro, NP, due to consideration for screening colonoscopy. Believes last colonoscopy in 2011 without polyps at outside facility.   Notes history of chronic GERD. Recently noted dysphagia with tougher textures. Has been on omeprazole historically then came off. Was using OTC agents. Got back on PPI when dysphagia started. Taking omeprazole once per day now. Dry mouth. Dysphagia with drier foods, tougher textures. History of dilations in the past at outside facility, with last in 2011.   Has had a few times of trying to pass BM and had to strain. BM usually daily. Occasionally may miss a day.With Women's probiotic feels bowel movements are better. Feels there is a piece hanging from BM. Has to wipe multiple times occasionally. No rectal pain. Hesitant to pursue colonoscopy now.   Past Medical History:  Diagnosis Date  . Hyperlipidemia   . Hypertension   . Osteoporosis     Past Surgical History:  Procedure Laterality Date  . ABDOMINAL HYSTERECTOMY    . COLONOSCOPY  2001   Dr. Aleene Davidson: normal  . ESOPHAGOGASTRODUODENOSCOPY  2011   Dr. Aleene Davidson: 5 cm hiatal hernia with lower esophageal ring, s/p dilation with [address]  . ESOPHAGOGASTRODUODENOSCOPY  2008   Dr. Aleene Davidson: lower esophageal stricture s/p dilation with [facility]. Hiatal hernia  . HEMORROIDECTOMY    . TONSILLECTOMY      Current Outpatient Medications  Medication Sig Dispense Refill  . acidophilus (RISAQUAD) CAPS capsule Take 1 capsule by mouth daily.    . Cholecalciferol (VITAMIN D3) 5000 units CAPS Take 1 capsule (5,000 Units total) by mouth daily. (Patient taking differently: Take 50,000 Units by mouth once a week. ) 90  capsule 0  . levothyroxine (SYNTHROID, LEVOTHROID) 50 MCG tablet Take 1 tablet (50 mcg total) by mouth daily. 90 tablet 1  . lisinopril (ZESTRIL) 20 MG tablet Take 20 mg by mouth daily.     Marland Kitchen omeprazole (PRILOSEC) 20 MG capsule Take 20 mg by mouth daily.    . simvastatin (ZOCOR) 10 MG tablet Take 10 mg by mouth daily.    Marland Kitchen ezetimibe (ZETIA) 10 MG tablet Take 10 mg by mouth daily. (Patient not taking: Reported on 03/23/2020)    . HYDROcodone-acetaminophen (NORCO/VICODIN) 5-325 MG tablet Take 1 tablet by mouth every 6 (six) hours as needed. (Patient not taking: Reported on 03/23/2020) 10 tablet 0  . MEGARED OMEGA-3 KRILL OIL 500 MG CAPS Take 1 capsule by mouth daily. (Patient not taking: Reported on 03/23/2020)    . meloxicam (MOBIC) 7.5 MG tablet Take 7.5 mg by mouth daily as needed for pain. (Patient not taking: Reported on 03/23/2020)    . ranitidine (ZANTAC) 150 MG tablet Take 150 mg by mouth at bedtime. (Patient not taking: Reported on 03/23/2020)     No current facility-administered medications for this visit.    Allergies as of 03/23/2020 - Review Complete 03/23/2020  Allergen Reaction Noted  . Ciprofloxacin  04/26/2015    Family History  Problem Relation Age of Onset  . Diabetes Mother   . Hypertension Mother   . Cancer Mother   . Hypertension Father   . CAD Father     Social History  Socioeconomic History  . Marital status: Married    Spouse name: Not on file  . Number of children: Not on file  . Years of education: Not on file  . Highest education level: Not on file  Occupational History  . Not on file  Tobacco Use  . Smoking status: Former Games developer  . Smokeless tobacco: Never Used  Vaping Use  . Vaping Use: Never used  Substance and Sexual Activity  . Alcohol use: No    Alcohol/week: 0.0 standard drinks  . Drug use: No  . Sexual activity: Not on file  Other Topics Concern  . Not on file  Social History Narrative  . Not on file   Social Determinants of Health    Financial Resource Strain:   . Difficulty of Paying Living Expenses: Not on file  Food Insecurity:   . Worried About Programme researcher, broadcasting/film/video in the Last Year: Not on file  . Ran Out of Food in the Last Year: Not on file  Transportation Needs:   . Lack of Transportation (Medical): Not on file  . Lack of Transportation (Non-Medical): Not on file  Physical Activity:   . Days of Exercise per Week: Not on file  . Minutes of Exercise per Session: Not on file  Stress:   . Feeling of Stress : Not on file  Social Connections:   . Frequency of Communication with Friends and Family: Not on file  . Frequency of Social Gatherings with Friends and Family: Not on file  . Attends Religious Services: Not on file  . Active Member of Clubs or Organizations: Not on file  . Attends Banker Meetings: Not on file  . Marital Status: Not on file  Intimate Partner Violence:   . Fear of Current or Ex-Partner: Not on file  . Emotionally Abused: Not on file  . Physically Abused: Not on file  . Sexually Abused: Not on file    Review of Systems: Gen: Denies any fever, chills, fatigue, weight loss, lack of appetite.  CV: Denies chest pain, heart palpitations, peripheral edema, syncope.  Resp: Denies shortness of breath at rest or with exertion. Denies wheezing or cough.  GI: see HPI GU : Denies urinary burning, urinary frequency, urinary hesitancy MS: Denies joint pain, muscle weakness, cramps, or limitation of movement.  Derm: Denies rash, itching, dry skin Psych: Denies depression, anxiety, memory loss, and confusion Heme: Denies bruising, bleeding, and enlarged lymph nodes.  Physical Exam: BP (!) 155/84   Pulse (!) 59   Temp (!) 97 F (36.1 C) (Oral)   Ht 5\' 2"  (1.575 m)   Wt 155 lb 9.6 oz (70.6 kg)   BMI 28.46 kg/m  General:   Alert and oriented. Pleasant and cooperative. Well-nourished and well-developed. Appears younger than stated age Head:  Normocephalic and atraumatic. Eyes:   Without icterus, sclera clear and conjunctiva pink.  Ears:  Normal auditory acuity. Mouth:  Mask in place Lungs:  Clear to auscultation bilaterally. No wheezes, rales, or rhonchi. No distress.  Heart:  S1, S2 present without murmurs appreciated.  Abdomen:  +BS, soft, non-tender and non-distended. No HSM noted. No guarding or rebound. No masses appreciated.  Rectal:  External hemorrhoid, appears left lateral column with small prolapse, easily reducible Msk:  Symmetrical without gross deformities. Normal posture. Extremities:  Without edema. Neurologic:  Alert and  oriented x4;  grossly normal neurologically. Skin:  Intact without significant lesions or rashes. Psych:  Alert and cooperative. Normal mood and affect.  ASSESSMENT: Kelli Grant is a 79 y.o. female presenting today at the request of PCP to discuss screening colonoscopy, as last was in 2011 without known polyps per patient. Procedure notes not available at this time.   She is reporting dysphagia in setting of chronic GERD, with prior dilations historically at outside facility (last in 2011). Previously had been off of PPI for unknown amount of time but now has resumed omeprazole daily. Needs EGD/dilation in near future.  We discussed at length screening colonoscopy. She is in good health at age 89. She is hesitant to pursue this right now, and she would like to pursue a more conservative approach. Declining cologuard as well. Will monitor for any changes in bowel habits, rectal bleeding, etc. She does have Grade 2 hemorrhoids. I have asked her to start fiber, limit toilet time, avoid straining.    PLAN:  Proceed with upper endoscopy/dilation by Dr. Marletta Lor in near future: the risks, benefits, and alternatives have been discussed with the patient in detail. The patient states understanding and desires to proceed.   Continue omeprazole daily  Continue probiotic and add Benefiber  Follow-up thereafter  Gelene Mink, PhD,  ANP-BC [facility]

## 2020-03-30 ENCOUNTER — Ambulatory Visit: Payer: Medicare Other | Admitting: Nurse Practitioner
